# Patient Record
Sex: Male | Born: 1990 | Race: Black or African American | Hispanic: No | Marital: Single | Smoking: Never smoker
Health system: Southern US, Community
[De-identification: ages and names within clinical notes are randomized; demographics above are authoritative.]

## PROBLEM LIST (undated history)

## (undated) DIAGNOSIS — J189 Pneumonia, unspecified organism: Secondary | ICD-10-CM

## (undated) HISTORY — DX: Pneumonia, unspecified organism: J18.9

---
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
## 2011-01-29 NOTE — Op Note (Signed)
[facility]  Patient:    Andrew Ponce, Andrew Ponce Visit Number: [id number] MRN: [id number]          Service Type: DSU Location: DAY Attending Physician:  Dennie Maizes Dictated by:   Dennie Maizes, M.D. Proc. Date: 09/14/01 Admit Date:  09/14/2001 Discharge Date: 09/14/2001   CC:         Karleen Hampshire, M.D.                           Operative Report  PREOPERATIVE DIAGNOSES: 1. Voiding difficulties. 2. Urinary frequency. 3. Meatal stenosis.  POSTOPERATIVE DIAGNOSES: 1. Voiding difficulties. 2. Urinary frequency. 3. Meatal stenosis.  OPERATION:   Meatotomy and cystoscopy.  SURGEON:  Dennie Maizes, M.D.  ANESTHESIA:  General.  COMPLICATIONS:  None.  INDICATIONS:  This 20-year-old male was evaluated for voiding difficulties, slow urinary stream, and urinary frequency and was found to have meatal stenosis.  He is taken to the operating room today for meatotomy and cystoscopy.  DESCRIPTION OF PROCEDURE:   General anesthesia was induced, and the patient was placed on the OR table in the supine position.   The legs were kept in a frog legged position.   The lower abdomen and genitalia were prepped and draped in a sterile fashion.  Severe meatal stenosis was noted.  The ______ of the meatus was crushed for about 5 mm with the hemostat.  A meatotomy was then made.  The edges of the meatus were then reapproximated using 5-0 chromic.  Cystoscopy was done with a 9-French pediatric cystoscope. The urethra was normal.  The verumontanum was normal.  There was no evidence of any posterior ureteral valve or bladder neck obstruction.  The appearance of the bladder was normal.  There was a single orifice on either side with normal configuration.   The instruments were removed.  The patient was transferred to the PACU in satisfactory condition. Dictated by:   Dennie Maizes, M.D. Attending Physician:  Dennie Maizes DD:  09/14/01 TD:  09/14/01 Job:  [id number]

## 2011-01-29 NOTE — H&P (Signed)
[facility]  Patient:    ZAIM, NITTA Visit Number: [id number] MRN: [id number]          Service Type: DSU Location: DAY Attending Physician:  Dennie Maizes Dictated by:   Dennie Maizes, M.D. Admit Date:  09/14/2001   CC:         Karleen Hampshire, M.D.                         History and Physical  CHIEF COMPLAINT:  Voiding difficulty, meatal stenosis.  HISTORY OF PRESENT ILLNESS:  This 20-year-old boy was referred to me by Dr. Regino Schultze for evaluation of GU symptoms.  He has been having suprapubic pain, right groin pain, and right scrotal pain for several weeks.  The pain is intermittent in nature and mild to moderate, with mild to moderate intensity. The patient also had urinary frequency x 10 and nocturia x 1.  He had difficulty in voiding, and his urinary stream was slow.  He has been treated with antibiotics with relief of most of his symptoms.  He continues to have a slow stream and difficulty in voiding.  There is no past history of dysuria, hematuria, or urinary tract infection.  PAST MEDICAL HISTORY:  Unremarkable.  PAST SURGICAL HISTORY:  Status post neonatal circumcision.  MEDICATIONS:  None.  ALLERGIES:  None.  FAMILY HISTORY:  Unremarkable.  PHYSICAL EXAMINATION:  VITAL SIGNS:  Height 4 feet 9 inches, weight 148 pounds.  HEENT:  Normal.  NECK:  No masses.  LUNGS:  Clear to auscultation.  HEART:  Regular rate and rhythm.  No murmurs.  ABDOMEN:  Soft.  No palpable flank mass.  No CVA tenderness.  Bladder not palpable.  GENITOURINARY:  Penis circumcised.  Meatal stenosis is noted.  Testes are normal.  There is no evidence of epididymitis or inguinal hernia.  IMPRESSION: 1. Meatal stenosis. 2. Voiding difficulty.  PLAN:  I discussed with the patient and his mother regarding the diagnosis and management options.  He is scheduled to undergo cystoscopy and meatotomy under anesthesia at [facility].  I have  explained to them about the diagnosis, operative details, alternative treatments, outcome, possible risks and complications, and they have agreed for the procedure to be done. Dictated by:   Dennie Maizes, M.D. Attending Physician:  Dennie Maizes DD:  09/13/01 TD:  09/13/01 Job: [id number]

## 2011-06-18 LAB — STREP A DNA PROBE: Group A Strep Probe: NEGATIVE

## 2011-06-18 LAB — RAPID STREP SCREEN (MED CTR MEBANE ONLY): Streptococcus, Group A Screen (Direct): NEGATIVE

## 2011-09-06 ENCOUNTER — Ambulatory Visit: Payer: 59

## 2011-09-06 DIAGNOSIS — R509 Fever, unspecified: Secondary | ICD-10-CM

## 2011-09-06 DIAGNOSIS — R05 Cough: Secondary | ICD-10-CM

## 2011-09-08 ENCOUNTER — Observation Stay (HOSPITAL_COMMUNITY): Payer: PPO

## 2011-09-08 ENCOUNTER — Observation Stay (HOSPITAL_COMMUNITY)
Admission: AD | Admit: 2011-09-08 | Discharge: 2011-09-10 | Disposition: A | Discharge status: Home / Self Care | Payer: PPO | Source: Ambulatory Visit | Attending: Family Medicine | Admitting: Family Medicine

## 2011-09-08 ENCOUNTER — Ambulatory Visit (INDEPENDENT_AMBULATORY_CARE_PROVIDER_SITE_OTHER): Payer: PPO

## 2011-09-08 DIAGNOSIS — J1100 Influenza due to unidentified influenza virus with unspecified type of pneumonia: Secondary | ICD-10-CM

## 2011-09-08 DIAGNOSIS — J159 Unspecified bacterial pneumonia: Secondary | ICD-10-CM

## 2011-09-08 DIAGNOSIS — J189 Pneumonia, unspecified organism: Principal | ICD-10-CM | POA: Insufficient documentation

## 2011-09-08 DIAGNOSIS — R0600 Dyspnea, unspecified: Secondary | ICD-10-CM | POA: Diagnosis present

## 2011-09-08 DIAGNOSIS — R0602 Shortness of breath: Secondary | ICD-10-CM | POA: Insufficient documentation

## 2011-09-08 LAB — CBC
Hemoglobin: 14.2 g/dL (ref 13.0–17.0)
MCH: 27.4 pg (ref 26.0–34.0)
MCHC: 35.4 g/dL (ref 30.0–36.0)

## 2011-09-08 LAB — COMPREHENSIVE METABOLIC PANEL
ALT: 37 U/L (ref 0–53)
Calcium: 9.9 mg/dL (ref 8.4–10.5)
GFR calc Af Amer: >90 mL/min (ref >90)
Glucose, Bld: 125 mg/dL — ABNORMAL HIGH (ref 70–99)
Sodium: 138 mEq/L (ref 135–145)
Total Protein: 8.5 g/dL — ABNORMAL HIGH (ref 6.0–8.3)

## 2011-09-08 LAB — DIFFERENTIAL
Eosinophils Relative: 3 % (ref 0–5)
Lymphocytes Relative: 23 % (ref 12–46)
Lymphs Abs: 1.5 10*3/uL (ref 0.7–4.0)
Neutrophils Relative %: 67 % (ref 43–77)

## 2011-09-08 MED ORDER — HEPARIN SODIUM (PORCINE) 5000 UNIT/ML IJ SOLN
5000.0000 [IU] | Freq: Three times a day (TID) | INTRAMUSCULAR | Status: DC
  Administered 2011-09-08 – 2011-09-10 (×5): 5000 [IU] via SUBCUTANEOUS
  Filled 2011-09-08 (×8): qty 1

## 2011-09-08 MED ORDER — ACETAMINOPHEN 325 MG PO TABS
650.0000 mg | ORAL_TABLET | Freq: Four times a day (QID) | ORAL | Status: DC | PRN
  Administered 2011-09-09: 650 mg via ORAL
  Filled 2011-09-08: qty 2

## 2011-09-08 MED ORDER — ONDANSETRON HCL 4 MG PO TABS: 4.0000 mg | ORAL_TABLET | Freq: Four times a day (QID) | ORAL | Status: DC | PRN

## 2011-09-08 MED ORDER — DEXTROSE 5 % IV SOLN
500.0000 mg | INTRAVENOUS | Status: DC
  Administered 2011-09-09 – 2011-09-10 (×2): 500 mg via INTRAVENOUS
  Filled 2011-09-08 (×3): qty 500

## 2011-09-08 MED ORDER — BENZONATATE 100 MG PO CAPS
100.0000 mg | ORAL_CAPSULE | Freq: Three times a day (TID) | ORAL | Status: DC | PRN
  Administered 2011-09-09 – 2011-09-10 (×2): 100 mg via ORAL
  Filled 2011-09-08 (×2): qty 1

## 2011-09-08 MED ORDER — DEXTROSE 5 % IV SOLN
1.0000 g | INTRAVENOUS | Status: DC
  Administered 2011-09-09 (×2): 1 g via INTRAVENOUS
  Filled 2011-09-08 (×3): qty 10

## 2011-09-08 MED ORDER — ONDANSETRON HCL 4 MG/2ML IJ SOLN: 4.0000 mg | Freq: Four times a day (QID) | INTRAMUSCULAR | Status: DC | PRN

## 2011-09-08 MED ORDER — ACETAMINOPHEN 650 MG RE SUPP: 650.0000 mg | Freq: Four times a day (QID) | RECTAL | Status: DC | PRN

## 2011-09-08 NOTE — H&P (Signed)
Andrew Ponce is an 20 y.o. male.   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 He also notes fatigue, fevers to 101 at home, chills, and night sweats.The cough is prodcutive of yellow-sputum. The shortness of breath has occurred at rest and is exacerbated by movement. He was evaluated approximately 1 weeks ago at [facility] and started on Tamiflu for clinical influenza in spite of a negative rapid test. After 5 days, his condition did not improve. Therefore, he was started on Omnicef for presumed community acquired pneumonia and Hycodan for the cough on 12/24. His condition did not improve, so he was evaluated again at [facility], where there was concern for a complicated pneumonia, but suspicion for immune compromise given his lack of leukocytosis.  Of note the patient states that he had a normal pre-college physical with immunizations and negative Tb skin test. He does not live in a dorm, denies recent overseas travel, and does not have any pets.    PCP: [facility] [place], [place]   No past medical history on file.  Past Surgical History  Procedure Date  . Knee cartilage surgery 2010    No family history on file. History  Substance Use Topics  . Smoking status: Not on file  . Smokeless tobacco: Not on file  . 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 He lives in an apartment. He denies high risk sexual behavior.     Allergies: No Known Allergies  Medications Prior to Admission: 1. Omnicef 300 mg BID 2. Hycodan   Results for orders placed during the hospital encounter of 09/08/11 (from the past 48 hour(s))  CBC     Status: Abnormal   Collection Time   09/08/11 10:34 PM      Component Value Range Comment   WBC 6.4  4.0 - 10.5  (K/uL)    RBC 5.18  4.22 - 5.81 (MIL/uL)    Hemoglobin 14.2  13.0 - 17.0 (g/dL)    HCT 04.5  40.9 - 81.1 (%)    MCV 77.4 (*) 78.0 - 100.0 (fL)    MCH 27.4  26.0 - 34.0 (pg)    MCHC 35.4  30.0 - 36.0 (g/dL)    RDW 91.4  78.2 - 95.6 (%)    Platelets 335  150 - 400 (K/uL)   COMPREHENSIVE METABOLIC PANEL     Status: Abnormal   Collection Time   09/08/11 10:34 PM      Component Value Range Comment   Sodium 138  135 - 145 (mEq/L)    Potassium 3.5  3.5 - 5.1 (mEq/L)    Chloride 98  96 - 112 (mEq/L)    CO2 26  19 - 32 (mEq/L)    Glucose, Bld 125 (*) 70 - 99 (mg/dL)    BUN 13  6 - 23 (mg/dL)    Creatinine, Ser 2.13  0.50 - 1.35 (mg/dL)    Calcium 9.9  8.4 - 10.5 (mg/dL)    Total Protein 8.5 (*) 6.0 - 8.3 (g/dL)    Albumin 3.8  3.5 - 5.2 (g/dL)    AST 21  0 - 37 (U/L)    ALT 37  0 - 53 (U/L)    Alkaline Phosphatase 73  39 - 117 (  U/L)    Total Bilirubin 0.4  0.3 - 1.2 (mg/dL)    GFR calc non Af Amer >90  >90 (mL/min)    GFR calc Af Amer >90  >90 (mL/min)   DIFFERENTIAL     Status: Normal   Collection Time   09/08/11 10:34 PM      Component Value Range Comment   Neutrophils Relative 67  43 - 77 (%)    Neutro Abs 4.2  1.7 - 7.7 (K/uL)    Lymphocytes Relative 23  12 - 46 (%)    Lymphs Abs 1.5  0.7 - 4.0 (K/uL)    Monocytes Relative 7  3 - 12 (%)    Monocytes Absolute 0.4  0.1 - 1.0 (K/uL)    Eosinophils Relative 3  0 - 5 (%)    Eosinophils Absolute 0.2  0.0 - 0.7 (K/uL)    Basophils Relative 1  0 - 1 (%)    Basophils Absolute 0.0  0.0 - 0.1 (K/uL)    No results found.  Review of Systems  Constitutional: Positive for fever, chills, malaise/fatigue and diaphoresis. Negative for weight loss.  HENT: Positive for congestion and sore throat.   Respiratory: Positive for cough, sputum production, shortness of breath and wheezing. Negative for hemoptysis and stridor.   Cardiovascular: Negative for chest pain and palpitations.  Gastrointestinal: Negative for nausea, vomiting and abdominal  pain.  Genitourinary: Negative for dysuria.  Skin: Negative for itching and rash.  Neurological: Positive for weakness and headaches. Negative for focal weakness.  Endo/Heme/Allergies: Does not bruise/bleed easily.   BP 115/73  Pulse 79  Temp 98.7 F (37.1 C)  Resp 18  SpO2 96%   Physical Exam  Constitutional: He appears well-developed and well-nourished.  Non-toxic appearance. No distress.  HENT:  Head: Normocephalic and atraumatic.  Mouth/Throat: Oropharynx is clear and moist and mucous membranes are normal.  Eyes: Conjunctivae and EOM are normal. Pupils are equal, round, and reactive to light.  Neck: Normal range of motion. Neck supple.  Cardiovascular: Normal rate, regular rhythm, normal heart sounds and normal pulses.   No murmur heard. Respiratory: He has decreased breath sounds in the right lower field and the left lower field. He has no wheezes. He has no rhonchi. He has no rales.       Persistent cough during exam   GI: Soft. Normal appearance and bowel sounds are normal. There is no tenderness.  Musculoskeletal: He exhibits no edema and no tenderness.  Lymphadenopathy:       Head (right side): No submental, no submandibular, no preauricular, no posterior auricular and no occipital adenopathy present.       Head (left side): No submental, no submandibular, no preauricular, no posterior auricular and no occipital adenopathy present.    He has no cervical adenopathy.    He has no axillary adenopathy.       Right: No epitrochlear adenopathy present.       Left: No epitrochlear adenopathy present.  Skin: Skin is warm and dry. No abrasion, no bruising and no rash noted.     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   1. Admit for observation under the care of Family Medicine Teaching Service 2. Dyspnea - get chest X-ray to evaluate;  start Ceftriaxone and Amoxicillin to cover for CAP resistant to outpatient therapy; check HIV given no immune response  3. Cough -  tessalon perles, mucinex for expectorant  4. FENGI - regular diet  5. PPX - heparin 5000 U  TID 6. Dispo - pending further work-up and clinical improvement   Mat Carne 09/09/2011, 12:18 AM   R2 Addendum to R1 History and Physical  BP 115/73  Pulse 79  Temp 98.7 F (37.1 C)  Resp 18  SpO2 96% General appearance: alert, cooperative and no distress Eyes: PERRL, EOMIT Nose: Nares normal. Septum midline. Mucosa normal. No drainage or sinus tenderness. Throat: lips, mucosa, and tongue normal; teeth and gums normal Neck: no adenopathy, no JVD and supple, symmetrical, trachea midline Lungs: clear to auscultation bilaterally and decreased breath sounds in bases Heart: regular rate and rhythm, S1, S2 normal, no murmur, click, rub or gallop Abdomen: soft, non-tender; bowel sounds normal; no masses,  no organomegaly Extremities: extremities normal, atraumatic, no cyanosis or edema Pulses: 2+ and symmetric Skin: Skin color, texture, turgor normal. No rashes or lesions Lymph nodes: Cervical, supraclavicular, and axillary nodes normal.  A/P: 20 year old male with no significant past medical history who comes in with Community Acquired Pneumonia +/- influenza or Viral illness without and elevated WBC count but with fevers, no improvement on outpatient antibiotics:  1) ID- likely this is Community Acquired Pneumonia with viral illness.  However, Dr. Dallas Schimke and [facility] concerned because of cardiomegaly seen on CXR, unusual distribution of infiltrates, and lack of WBC elevation, or immune response.  Will treat for CAP with Ceftriaxone and Azithromycin.  Will also obtain CBC and differential, and obtain HIV.  Consider further evaluation if patient does not improve.  2) Pulm- patient with decreased breath sounds on exam, but has been stable on room  air, monitor pulse oximetry per routine with vitals, oxygen if needed.   Will give tessalon pearls and mucinex as needed for cough 3) CV- patient with no cardiac problems, but cardiomegaly on CXR at urgent care.  Will repeat CXR and consider cardiac evaluation if no improvement.  4) FEN/GI- patient with good PO intake, will give regular diet and saline lock IV.  5) DVT ppx- SQ heparin 6) Disposition- pending clinical improvement.   Ivon Roedel 09/09/2011 12:53 AM

## 2011-09-09 ENCOUNTER — Encounter (HOSPITAL_COMMUNITY): Payer: Self-pay | Admitting: Family Medicine

## 2011-09-09 ENCOUNTER — Observation Stay (HOSPITAL_COMMUNITY): Payer: PPO

## 2011-09-09 ENCOUNTER — Other Ambulatory Visit: Payer: Self-pay

## 2011-09-09 DIAGNOSIS — R0600 Dyspnea, unspecified: Secondary | ICD-10-CM | POA: Diagnosis present

## 2011-09-09 DIAGNOSIS — I517 Cardiomegaly: Secondary | ICD-10-CM

## 2011-09-09 LAB — BASIC METABOLIC PANEL
CO2: 26 mEq/L (ref 19–32)
Chloride: 100 mEq/L (ref 96–112)
Potassium: 4 mEq/L (ref 3.5–5.1)
Sodium: 136 mEq/L (ref 135–145)

## 2011-09-09 MED ORDER — GUAIFENESIN ER 600 MG PO TB12
600.0000 mg | ORAL_TABLET | Freq: Two times a day (BID) | ORAL | Status: DC
  Administered 2011-09-09 – 2011-09-10 (×4): 600 mg via ORAL
  Filled 2011-09-09 (×5): qty 1

## 2011-09-09 NOTE — H&P (Signed)
FMTS Attending Admission Note: Andrew Levy MD [phone] pager office [phone] I  have seen and examined this patient, reviewed their chart. I have discussed this patient with the resident at the time of admission and again  This morning.. I agree with the resident's findings, assessment and care plan. This is an atypical pneumonia in that a healthy 20 year old has not improved on antibiotic therapy as an putpatient and that he has not mounted a white blood cell response. Have discussed with him and his Mom  This morning and we will get a CT scan. The CXR looked like cardiomegally but the cxr was not a great inspiratory film---maybe due to teh pneumonia---so I think I will wait on the CT scan before we decide if we need an ECHo to evaulate for pericardial issues. He has no signs of pericarditis---no rub, etc. HIV pending as well.

## 2011-09-09 NOTE — Progress Notes (Signed)
PGY-1 Daily Progress Note Family Medicine Teaching Service D. Piloto Rolene Arbour, MD Service Pager: [phone]  Patient name: Andrew Ponce  Medical record [id number] Date of birth:28-May-1991 Age: 20 y.o. Gender: male  LOS: 1 day   Subjective: Feeling better today no SOB, No O2 requirement. Afebrile.  Objective:  Vitals: Temp: 99.8 F Pulse Rate: 83  Resp: 16   BP: 120/72 mmHg  SpO2: 95 %  Physical Exam: Constitutional: He appears well-developed and well-nourished. Non-toxic appearance. No distress.  HENT:  Mouth/Throat: Oropharynx is clear and moist and mucous membranes are normal.  Eyes: Conjunctivae and EOM are normal. Pupils are equal, round, and reactive to light.  Neck: Normal range of motion. Neck supple.  Cardiovascular: Normal rate, regular rhythm, normal heart sounds and normal pulses. No murmur heard.  Respiratory: He has decreased breath sounds in the right lower field and the left lower field. Rales present in bilateral lung fields. He has no wheezes and no rhonchi.  GI: Soft. Normal appearance and bowel sounds are normal. There is no tenderness.  Musculoskeletal: No edema and no tenderness.  No Lymphadenopathies present on physical examination.  Skin: Skin is warm and dry. No abrasion, no bruising and no rash noted.   Labs and imaging:  CBC  Lab 09/08/11 2234  WBC 6.4  HGB 14.2  HCT 40.1  PLT 335   BMET  Lab 09/09/11 0618 09/08/11 2234  NA 136 138  K 4.0 3.5  CL 100 98  CO2 26 26  BUN 13 13  CREATININE 1.01 0.99  LABGLOM -- --  GLUCOSE 94 --  CALCIUM 9.2 9.9    X-ray Chest Pa And Lateral  09/09/2011 .  IMPRESSION: Perihilar infiltrates greater on the left with atelectatic changes. Recommend follow-up until clearance.  Cardiomegaly.  Pulmonary vascular prominence.  Original Report Authenticated By: Fuller Canada, M.D.   Medications: Medication Dose Route Frequency  . acetaminophen (TYLENOL) tablet 650 mg  650 mg Oral Q6H PRN    .  acetaminophen (TYLENOL) suppository 650 mg  650 mg Rectal Q6H PRN  . azithromycin (ZITHROMAX) 500 mg in dextrose 5 % 250 mL IVPB  500 mg Intravenous Q24H  . benzonatate (TESSALON) capsule 100 mg  100 mg Oral TID PRN  . cefTRIAXone (ROCEPHIN) 1 g in dextrose 5 % 50 mL IVPB  1 g Intravenous Q24H  . guaiFENesin (MUCINEX) 12 hr tablet 600 mg  600 mg Oral BID  . heparin injection 5,000 Units  5,000 Units Subcutaneous Q8H  . ondansetron (ZOFRAN) tablet 4 mg  4 mg Oral Q6H PRN    . ondansetron (ZOFRAN) injection 4 mg  4 mg Intravenous Q6H PRN   Assessment and Plan:  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   1. PNA: normal WBC, afebrile. But positive on Physical exam of rales on both lung fields. -OnCeftriaxone and Azithromycin to cover for CAP resistant to outpatient therapy and atypical pathogen. -Pending of CT chest. 2. Increased cardiac siluette in CXR: Poor aeration on CXR. Pending ECHO and EKG 12 leads to fully evaluate.   4. FENGI - regular diet  5. PPX - heparin 5000 U [place] TID  6. Dispo - pending further work-up and clinical improvement   D. Piloto Rolene Arbour, MD PGY1, Family Medicine Teaching Service Pager [phone] 09/09/2011

## 2011-09-09 NOTE — Progress Notes (Signed)
Pt admitted for SOB with min exertion, cough and fever for about approx 1 week. Pt repts that his throat is very "irritated". Pt denies HA or body aches. Pt repts that he has phlegm in his throat that he can't cough up because it is very thick and his throat is sore. Pt encouraged to increase fluids to thin secretions and pt instructed proper IS usage. Pt is only able to raise IS to 500 ml. Pt verb understanding of all teachings and agrees to comply. Pt is currently receiving scheduled guafenisen per order.  Pt repts that he has no resp problems at rest and no visible s/sx of such. Pt repts only SOB with min activity. No s/sx cardiac distress and no c/o such. Vital signs stable. Pt oxygen saturation 95% RA. Pt denies pain. Pt repts LBM was "last night". Pt denies nausea or vomiting and repts appetite is "fair to good". Pt voids quantity sufficient without difficulty. Pt ambulates in room with steady gait. Mother is at bedside and is very supportive.

## 2011-09-09 NOTE — Progress Notes (Signed)
Discussed in rounds and agree.  This patient was seen earlier today by Dr. Jennette Kettle as attending.

## 2011-09-09 NOTE — Progress Notes (Signed)
*  PRELIMINARY RESULTS* Echocardiogram 2D Echocardiogram has been performed.  Andrew Ponce [facility] 09/09/2011, 1:48 PM

## 2011-09-10 ENCOUNTER — Encounter (HOSPITAL_COMMUNITY): Payer: Self-pay | Admitting: *Deleted

## 2011-09-10 LAB — HIV ANTIBODY (ROUTINE TESTING W REFLEX): HIV: NONREACTIVE

## 2011-09-10 LAB — LEGIONELLA ANTIGEN, URINE: Legionella Antigen, Urine: NEGATIVE

## 2011-09-10 MED ORDER — LEVOFLOXACIN 750 MG PO TABS: 750.0000 mg | ORAL_TABLET | Freq: Every day | ORAL | Status: DC

## 2011-09-10 MED ORDER — LEVOFLOXACIN 750 MG PO TABS: 750.0000 mg | ORAL_TABLET | Freq: Every day | ORAL | Status: AC

## 2011-09-10 MED ORDER — GUAIFENESIN ER 600 MG PO TB12: 600.0000 mg | ORAL_TABLET | Freq: Two times a day (BID) | ORAL | Status: DC

## 2011-09-10 MED ORDER — GUAIFENESIN ER 600 MG PO TB12: 600.0000 mg | ORAL_TABLET | Freq: Two times a day (BID) | ORAL | Status: AC

## 2011-09-10 MED ORDER — BENZONATATE 100 MG PO CAPS: 100.0000 mg | ORAL_CAPSULE | Freq: Three times a day (TID) | ORAL | Status: AC | PRN

## 2011-09-10 MED ORDER — BENZONATATE 100 MG PO CAPS: 100.0000 mg | ORAL_CAPSULE | Freq: Three times a day (TID) | ORAL | Status: DC | PRN

## 2011-09-10 NOTE — Discharge Summary (Signed)
Physician Discharge Summary  Patient ID: Andrew Ponce [id number] 09-20-1990 20 y.o.  Admit date: 09/08/2011 Discharge date: 09/10/2011  PCP: No primary provider on file.   Discharge Diagnosis: 1. Community Acquired Pneumonia   Discharge Medications  Andrew Ponce  Home Medication Instructions [id number]   Printed on:09/10/11 1238  Medication Information                    benzonatate (TESSALON) 100 MG capsule Take 1 capsule (100 mg total) by mouth 3 (three) times daily as needed for cough.           guaiFENesin (MUCINEX) 600 MG 12 hr tablet Take 1 tablet (600 mg total) by mouth 2 (two) times daily.           levofloxacin (LEVAQUIN) 750 MG tablet Take 1 tablet (750 mg total) by mouth daily.              Consults: None  Labs: CBC  Lab 09/08/11 2234  WBC 6.4  HGB 14.2  HCT 40.1  PLT 335   BMET  Lab 09/09/11 0618 09/08/11 2234  NA 136 138  K 4.0 3.5  CL 100 98  CO2 26 26  BUN 13 13  CREATININE 1.01 0.99  CALCIUM 9.2 9.9  PROT -- 8.5*  BILITOT -- 0.4  ALKPHOS -- 73  ALT -- 37  AST -- 21  GLUCOSE 94 125*   DIFFERENTIAL     Status: Normal   Collection Time   09/08/11 10:34 PM      Component Value Range Comment   Neutrophils Relative 67  43 - 77 (%)    Neutro Abs 4.2  1.7 - 7.7 (K/uL)    Lymphocytes Relative 23  12 - 46 (%)    Lymphs Abs 1.5  0.7 - 4.0 (K/uL)    Monocytes Relative 7  3 - 12 (%)    Monocytes Absolute 0.4  0.1 - 1.0 (K/uL)    Eosinophils Relative 3  0 - 5 (%)    Eosinophils Absolute 0.2  0.0 - 0.7 (K/uL)    Basophils Relative 1  0 - 1 (%)    Basophils Absolute 0.0  0.0 - 0.1 (K/uL)   HIV ANTIBODY (ROUTINE TESTING)     Status: Normal   Collection Time   09/08/11 10:34 PM      Component Value Range Comment   HIV NON REACTIVE  NON REACTIVE    STREP PNEUMONIAE URINARY ANTIGEN     Status: Normal   Collection Time   09/09/11  9:18 AM      Component Value Range Comment   Strep Pneumo Urinary Antigen NEGATIVE  NEGATIVE       Procedures/Imaging:  X-ray Chest Pa And Lateral  09/09/2011  IMPRESSION: Perihilar infiltrates greater on the left with atelectatic changes. Recommend follow-up until clearance.  Cardiomegaly.  Pulmonary vascular prominence.  Original Report Authenticated By: Andrew Ponce, M.D.   Ct Chest Wo Contrast 09/09/2011    IMPRESSION: Bilateral pneumonia, left greater than right, with consolidated and patchy airspace components.  No associated pleural effusions. Please follow-up until clearing.  Original Report Authenticated By: Andrew Ponce, M.D.    Brief Hospital Course: Mr. 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  1. PNA: normal WBC, afebrile. But positive on Physical exam of rales on both lung fields, CXR and CT positive for  PNA. HIV non reactive and strep pneumo negative. -On Ceftriaxone and Azithromycin to cover for CAP resistant to outpatient therapy and atypical pathogen with improvement of SOB. No O2 requirement. Discharge stable and on Levaquin for 7 days. 2. Increased cardiac siluette in CXR: Poor aeration on CXR.  ECHO with mild LVH normal EF. Normal BP during hospitalization. hemodynamically stable.  Patient condition at time of discharge/disposition:  Patient is discharge home on stable medical condition.   Follow up issues: 1. Improvement of PNA. 2. Mild LVH f/u  Discharge follow up:  Discharge Orders    Future Orders Please Complete By Expires   Increase activity slowly     Follow up with [facility]. Pt should make an appointment within 7 to 10 days of today.    D. Andrew Rolene Arbour, MD  [facility] 09/10/2011

## 2011-09-10 NOTE — Progress Notes (Signed)
  Subjective:    Patient ID: Andrew Ponce, male    DOB: November 14, 1990, 20 y.o.   MRN: [id number]  HPI  Seen and examined.  Feels much better.  Discussed in rounds.  Agree with DC today on levoquin.    Review of Systems     Objective:   Physical Exam        Assessment & Plan:

## 2011-09-10 NOTE — Discharge Summary (Signed)
I saw and examined earlier today - please see my note.  I agree with the DC management and documentation by Dr. Piloto. 

## 2011-09-10 NOTE — Plan of Care (Signed)
Problem: Discharge Progression Outcomes Goal: Other Discharge Outcomes/Goals Pt. For f/u at urgent care.

## 2013-04-13 IMAGING — CT CT CHEST W/O CM
4 series · 17 of 30 positions shown, 19 images · non-contrast
Comparison: None.

CLINICAL DATA: Atypical pneumonia

CT CHEST WITHOUT CONTRAST
TECHNIQUE: Multidetector CT imaging of the chest was performed
following the standard protocol without IV contrast.

[Series 2: routine chest · axial · 0.83mm/px · z∈[-233,-23]mm · 5 of 70 slices shown, 7 images]
[im 14/70  mediastinal]
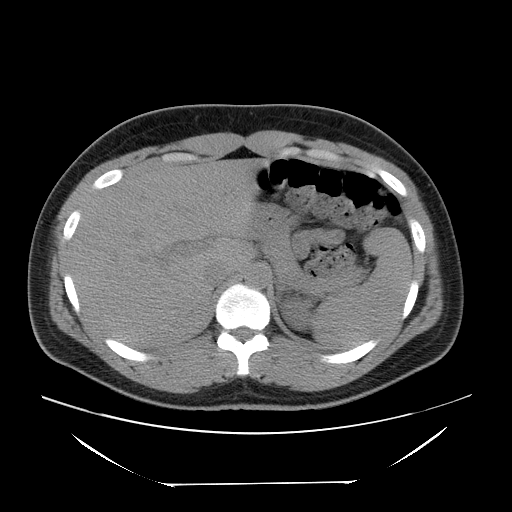
[im 14/70  lung]
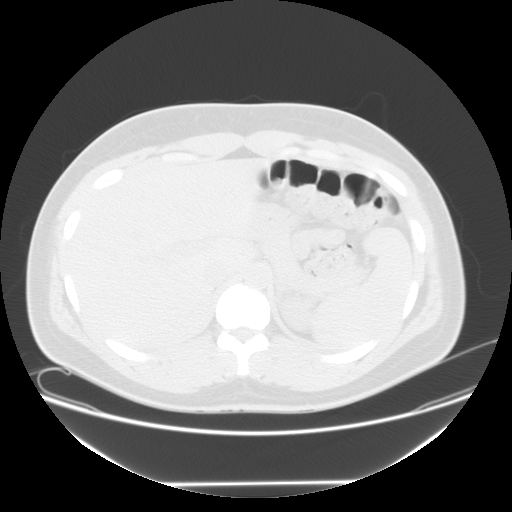
[im 28/70  lung]
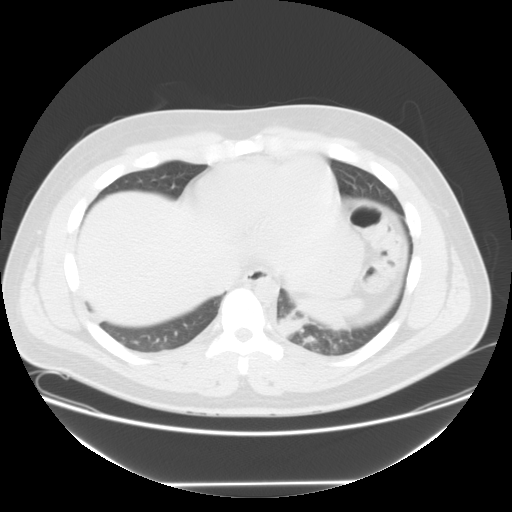
[im 38/70  lung]
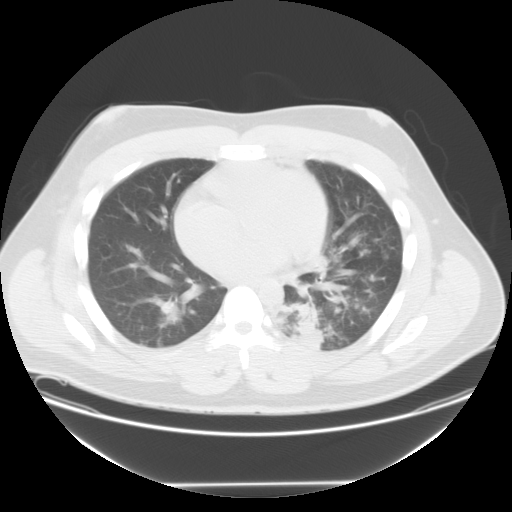
[im 42/70  lung]
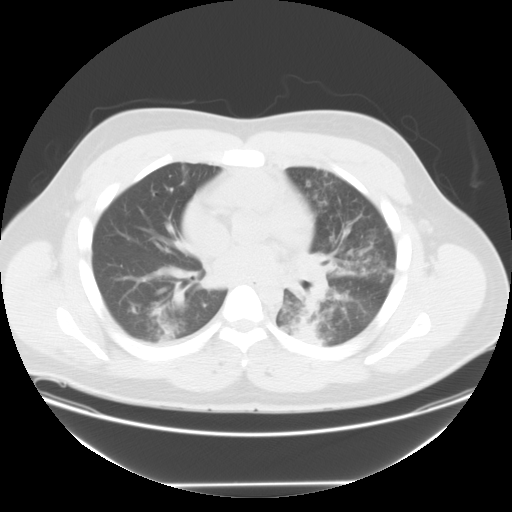
[im 56/70  mediastinal]
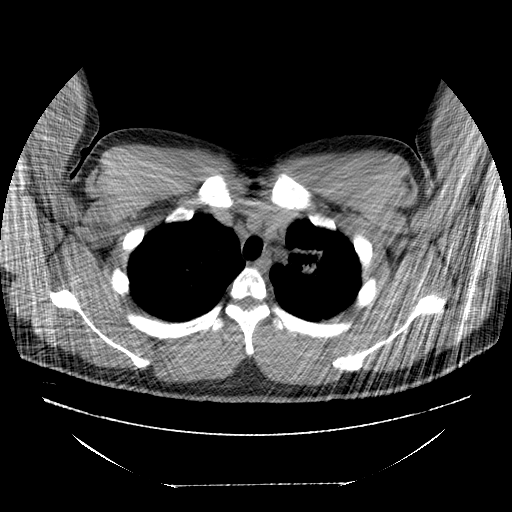
[im 56/70  lung]
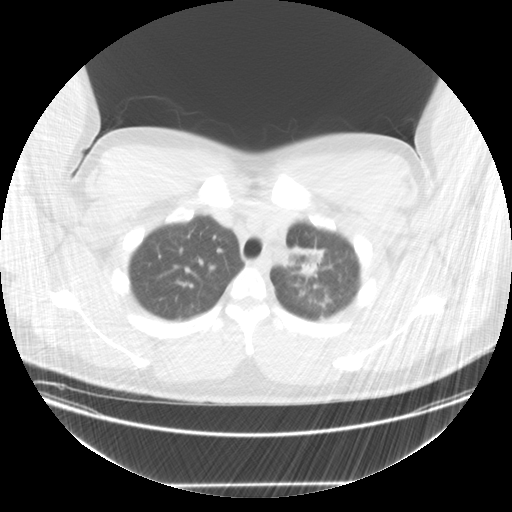

[Series 3: recon 2: routine chest · axial · 0.83mm/px · z∈[-128,-53]mm · 3 of 47 slices shown]
[im 16/47  lung]
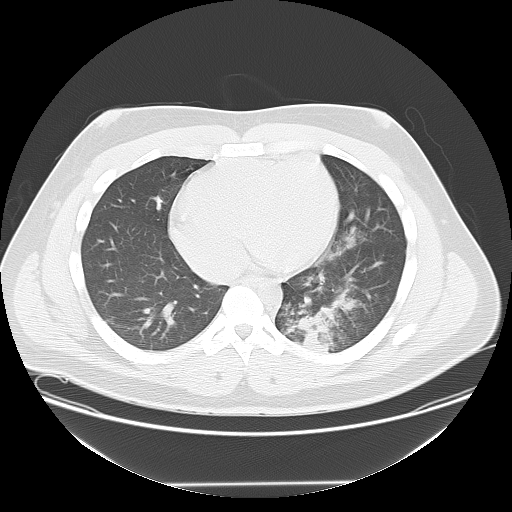
[im 19/47  lung]
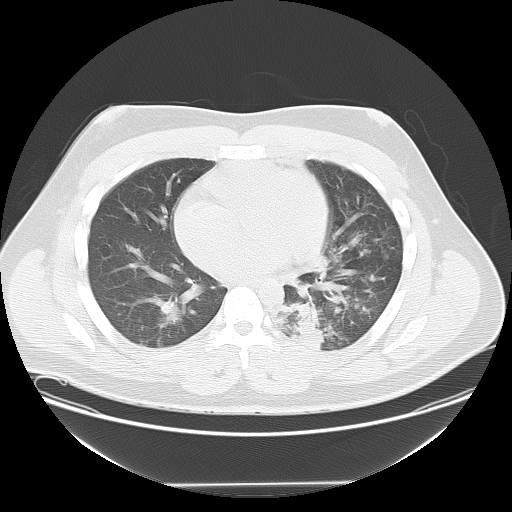
[im 31/47  lung]
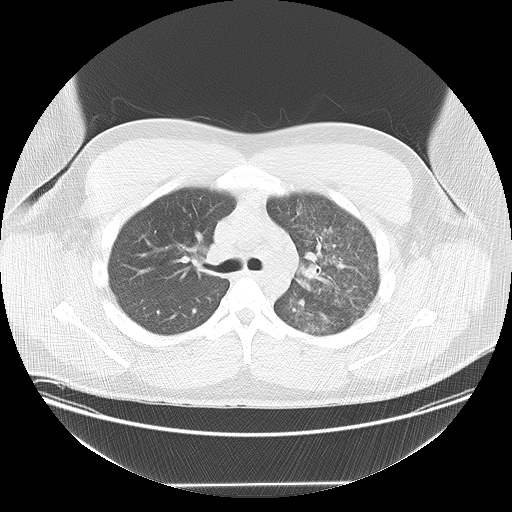

[Series 400: coronals · coronal · 0.83mm/px · 1 of 89 slices shown]
[im 13/89  lung]
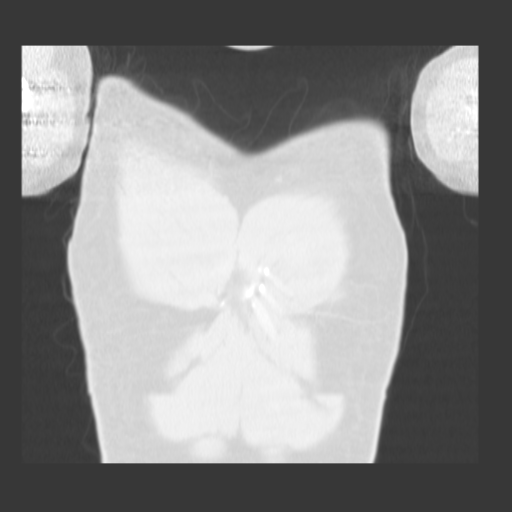

[Series 401: sagittals · sagittal · 0.83mm/px · 8 of 111 slices shown]
[im 13/111  lung]
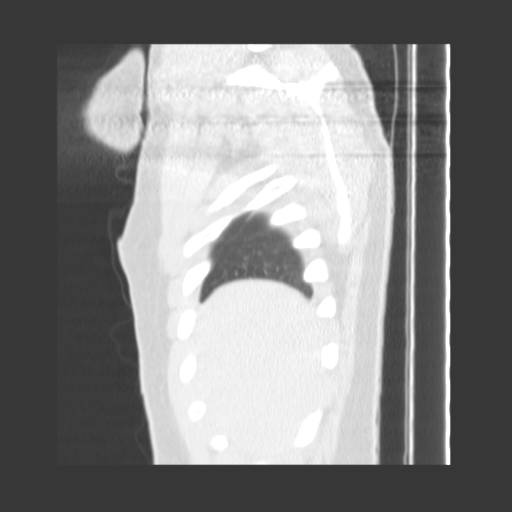
[im 25/111  lung]
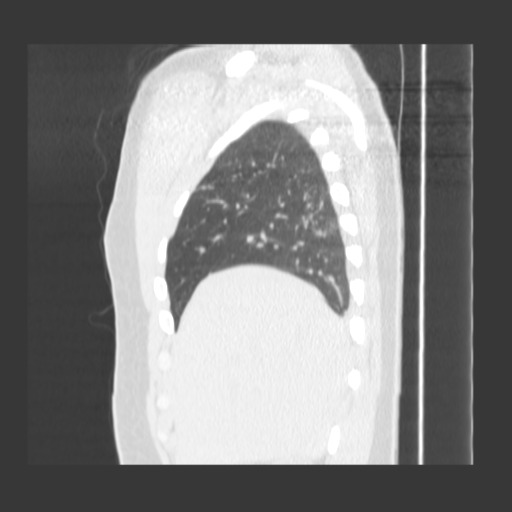
[im 37/111  lung]
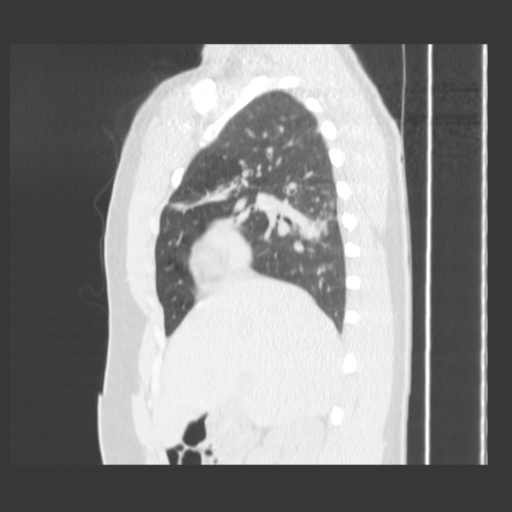
[im 49/111  lung]
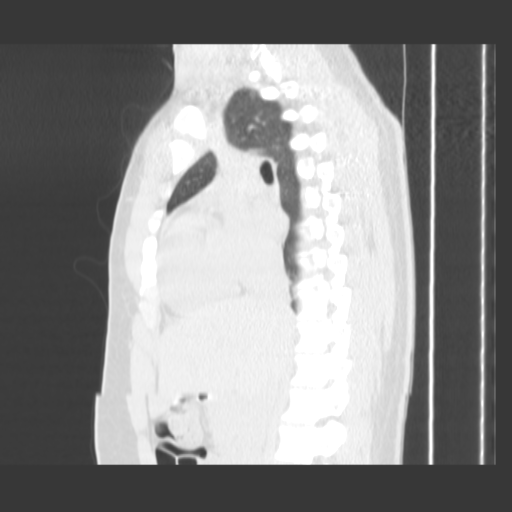
[im 62/111  lung]
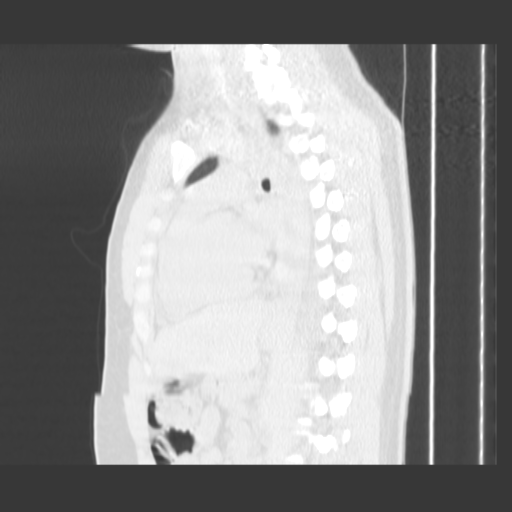
[im 74/111  lung]
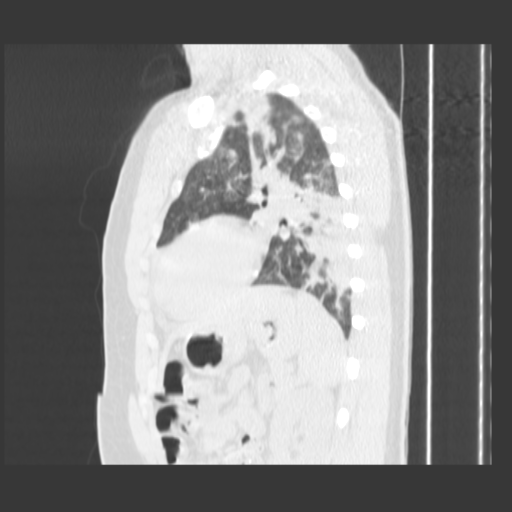
[im 86/111  lung]
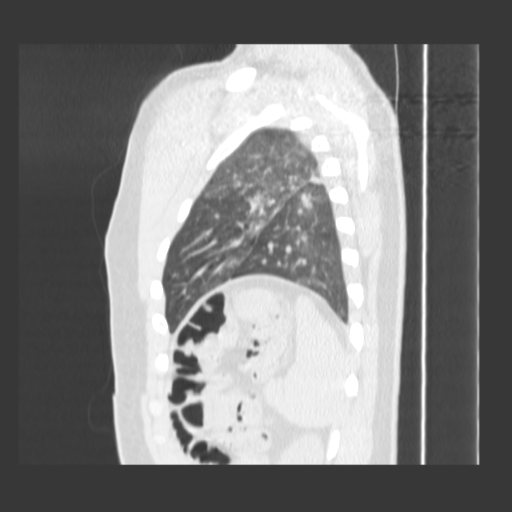
[im 98/111  lung]
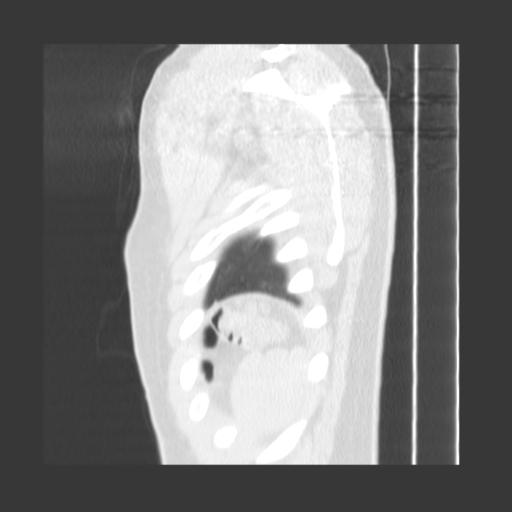

[17 of 30 positions shown; findings below may reference images not displayed]

FINDINGS: There are patchy and consolidated infiltrates
bilaterally, left greater than right.  There are no associated
pleural effusions.  There is no gross axillary, mediastinal, or
hilar adenopathy.  Several AP window lymph nodes are present but
are not enlarged.  The osseous structures and visualized upper
abdomen have an unremarkable noncontrasted appearance.
IMPRESSION: Bilateral pneumonia, left greater than right, with consolidated and
patchy airspace components.  No associated pleural effusions.
Please follow-up until clearing.

## 2015-05-16 ENCOUNTER — Ambulatory Visit (INDEPENDENT_AMBULATORY_CARE_PROVIDER_SITE_OTHER): Payer: Self-pay | Admitting: Family Medicine

## 2015-05-16 VITALS — BP 134/72 | HR 72 | Temp 98.1°F | Resp 18 | Ht 70.0 in | Wt 281.0 lb

## 2015-05-16 DIAGNOSIS — R42 Dizziness and giddiness: Secondary | ICD-10-CM

## 2015-05-16 DIAGNOSIS — R12 Heartburn: Secondary | ICD-10-CM

## 2015-05-16 DIAGNOSIS — R079 Chest pain, unspecified: Secondary | ICD-10-CM

## 2015-05-16 LAB — POCT CBC
GRANULOCYTE PERCENT: 50.2 % (ref 37–80)
HEMATOCRIT: 45.5 % (ref 43.5–53.7)
Hemoglobin: 14.4 g/dL (ref 14.1–18.1)
Lymph, poc: 1.9 (ref 0.6–3.4)
MCH: 25.8 pg — AB (ref 27–31.2)
MCHC: 31.7 g/dL — AB (ref 31.8–35.4)
MCV: 81.3 fL (ref 80–97)
MID (cbc): 0.3 (ref 0–0.9)
MPV: 7.3 fL (ref 0–99.8)
PLATELET COUNT, POC: 201 10*3/uL (ref 142–424)
POC GRANULOCYTE: 2.2 (ref 2–6.9)
POC LYMPH %: 43.1 % (ref 10–50)
POC MID %: 6.7 %M (ref 0–12)
RBC: 5.6 M/uL (ref 4.69–6.13)
RDW, POC: 13.8 %
WBC: 4.3 10*3/uL — AB (ref 4.6–10.2)

## 2015-05-16 LAB — GLUCOSE, POCT (MANUAL RESULT ENTRY): POC Glucose: 103 mg/dl — AB (ref 70–99)

## 2015-05-16 MED ORDER — OMEPRAZOLE 20 MG PO CPDR: 20.0000 mg | DELAYED_RELEASE_CAPSULE | Freq: Every day | ORAL | Status: AC

## 2015-05-16 NOTE — Progress Notes (Signed)
Subjective:    Patient ID: Andrew Ponce, male    DOB: 1991-07-07, 24 y.o.   MRN: [id number]  HPI Andrew Ponce is a 24 y.o. male   Presents with chest pain and dizziness.  Started about 8-9 days - walking around - felt heaviness in chest, no dyspnea, no lightheadedness, no palpitations or n/v at the time.  Tried drinking a soda.  Pain improved after drinking a soda and belching. Has been having some heartburn. Tried otc pills for heartburn he received from his mom.  Minimal improvement. Dizzy/lightheaded with walking at times - 1st noticed 8 days ago. Chest pain and dizziness. Comes and goes. Has not had true syncope. Has to sit down at times. Improves with sitting. Lightheaded with walking. Felt better yesterday, but lifted weights last night, then noted lightheadedness after workout. No chest pain or dizziness during exercise/exertion. Occasional heartburn.   No stool changes, no dark/tarry stools. No current chest pain.   Also feels like his arms are shaky - past 4-5 days. No seizure activity, no weakness, normal use of arms.  Last ate 5 hours ago. No known fh of DM.   Slight stress with work and home, but doesn't feel overwhelmed or anxious.  Moved from [place] few weeks ago, new job - Investment banker, corporate.  Living with mom at the time. Nonsmoker, no alcohol, no IDU. Usually works out 3-5 days per week - no chest pain with this in past.    Chart reviewed, admitted in 2012 for community-acquired pneumonia.  Patient Active Problem List   Diagnosis Date Noted  . Dyspnea 09/09/2011   Past Medical History  Diagnosis Date  . Pneumonia    Past Surgical History  Procedure Laterality Date  . Knee cartilage surgery  2010  . Knee surgery Left 2010   No Known Allergies Prior to Admission medications   Not on File   Social History   Social History  . Marital Status: Single    Spouse Name: N/A  . Number of Children: N/A  . Years of Education: N/A   Occupational History  . Not on  file.   Social History Main Topics  . Smoking status: Never Smoker   . Smokeless tobacco: Not on file  . Alcohol Use: No  . Drug Use: No  . Sexual Activity: Not on file   Other Topics Concern  . 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 He lives in an apartment. He denies high risk sexual behavior.        Review of Systems  Constitutional: Negative for fever, chills and diaphoresis.  Respiratory: Positive for chest tightness. Negative for cough, shortness of breath and wheezing.   Cardiovascular: Positive for chest pain. Negative for palpitations and leg swelling.  Gastrointestinal: Positive for constipation (occasional. ). Negative for abdominal pain.  Genitourinary: Negative for difficulty urinating.  Neurological: Positive for dizziness, tremors (shakiness in arms. ) and light-headedness. Negative for facial asymmetry and numbness.       Objective:   Physical Exam  Constitutional: He is oriented to person, place, and time. He appears well-developed and well-nourished. No distress.  overweight  HENT:  Head: Normocephalic and atraumatic.  Eyes: EOM are normal. Pupils are equal, round, and reactive to light.  Neck: No JVD present. Carotid bruit is not present.  Cardiovascular: Normal rate, regular rhythm and normal heart sounds.   No murmur heard. Pulmonary/Chest: Effort normal  and breath sounds normal. He has no rales.  Abdominal: Soft. Bowel sounds are normal. He exhibits no distension. There is no tenderness.  Musculoskeletal: He exhibits no edema.  Neurological: He is alert and oriented to person, place, and time. He has normal strength. No cranial nerve deficit or sensory deficit. He displays a negative Romberg sign. Coordination normal.  No pronator drift, normal finger to nose.   Skin: Skin is warm and dry. No rash noted. He is not diaphoretic.  Psychiatric: He has a normal mood and affect. His  behavior is normal. Judgment and thought content normal.  Vitals reviewed.  Filed Vitals:   05/16/15 1254  BP: 134/72  Pulse: 72  Temp: 98.1 F (36.7 C)  TempSrc: Oral  Resp: 18  Height:  (1.778 m)  Weight: 281 lb (127.461 kg)  SpO2: 99%   EKG: sinus rhythm, no acute findings.   Results for orders placed or performed in visit on 05/16/15  POCT glucose (manual entry)  Result Value Ref Range   POC Glucose 103 (A) 70 - 99 mg/dl  POCT CBC  Result Value Ref Range   WBC 4.3 (A) 4.6 - 10.2 K/uL   Lymph, poc 1.9 0.6 - 3.4   POC LYMPH PERCENT 43.1 10 - 50 %L   MID (cbc) 0.3 0 - 0.9   POC MID % 6.7 0 - 12 %M   POC Granulocyte 2.2 2 - 6.9   Granulocyte percent 50.2 37 - 80 %G   RBC 5.60 4.69 - 6.13 M/uL   Hemoglobin 14.4 14.1 - 18.1 g/dL   HCT, POC 19.1 47.8 - 53.7 %   MCV 81.3 80 - 97 fL   MCH, POC 25.8 (A) 27 - 31.2 pg   MCHC 31.7 (A) 31.8 - 35.4 g/dL   RDW, POC 29.5 %   Platelet Count, POC 201 142 - 424 K/uL   MPV 7.3 0 - 99.8 fL       Assessment & Plan:   Andrew Ponce is a 24 y.o. male Chest pain, unspecified chest pain type - Plan: EKG 12-Lead, POCT CBC Heartburn - Plan: omeprazole (PRILOSEC) 20 MG capsule  -Overall reassuring blood count, and EKG. Based on his description of symptoms, there may be a heartburn as primary cause. Can try trigger avoidance with handout below, start omeprazole. If symptoms are not improving, would recommend evaluation with cardiologist. RTC/ER chest pain precautions reviewed.  Dizziness - Plan: POCT glucose (manual entry), POCT CBC  -Reassuring CBC and vital signs. Advised increased fluids, but if the symptoms persist, especially with any recurrence of chest pain, would recommend cardiology evaluation. Avoid exertional exercise for now until symptoms have completely resolved, then if they do recur with exercise, return to clinic for evaluation, or ER if necessary. Understanding expressed.  Meds ordered this encounter  Medications    . omeprazole (PRILOSEC) 20 MG capsule    Sig: Take 1 capsule (20 mg total) by mouth daily.    Dispense:  30 capsule    Refill:  1   Patient Instructions  Your EKG and blood work today looked overall okay. Rest of this weekend, drink 20 of fluids, start acid blocker as your symptoms may be due to heartburn, and avoid foods as listed below that can worsen heartburn. If your dizziness or chest pain is persisting into next week, I would recommend you be seen by cardiologist. Call me and I can arrange for this to happen. If any worsening of symptoms, go to emergency  room. Did not return to workouts or exertion until your symptoms have resolved, then if they do come back, you should be evaluated by cardiologist.  Return to the clinic or go to the nearest emergency room if any of your symptoms worsen or new symptoms occur.  Chest Pain (Nonspecific) It is often hard to give a specific diagnosis for the cause of chest pain. There is always a chance that your pain could be related to something serious, such as a heart attack or a blood clot in the lungs. You need to follow up with your health care provider for further evaluation. CAUSES   Heartburn.  Pneumonia or bronchitis.  Anxiety or stress.  Inflammation around your heart (pericarditis) or lung (pleuritis or pleurisy).  A blood clot in the lung.  A collapsed lung (pneumothorax). It can develop suddenly on its own (spontaneous pneumothorax) or from trauma to the chest.  Shingles infection (herpes zoster virus). The chest wall is composed of bones, muscles, and cartilage. Any of these can be the source of the pain.  The bones can be bruised by injury.  The muscles or cartilage can be strained by coughing or overwork.  The cartilage can be affected by inflammation and become sore (costochondritis). DIAGNOSIS  Lab tests or other studies may be needed to find the cause of your pain. Your health care provider may have you take a test called  an ambulatory electrocardiogram (ECG). An ECG records your heartbeat patterns over a 24-hour period. You may also have other tests, such as:  Transthoracic echocardiogram (TTE). During echocardiography, sound waves are used to evaluate how blood flows through your heart.  Transesophageal echocardiogram (TEE).  Cardiac monitoring. This allows your health care provider to monitor your heart rate and rhythm in real time.  Holter monitor. This is a portable device that records your heartbeat and can help diagnose heart arrhythmias. It allows your health care provider to track your heart activity for several days, if needed.  Stress tests by exercise or by giving medicine that makes the heart beat faster. TREATMENT   Treatment depends on what may be causing your chest pain. Treatment may include:  Acid blockers for heartburn.  Anti-inflammatory medicine.  Pain medicine for inflammatory conditions.  Antibiotics if an infection is present.  You may be advised to change lifestyle habits. This includes stopping smoking and avoiding alcohol, caffeine, and chocolate.  You may be advised to keep your head raised (elevated) when sleeping. This reduces the chance of acid going backward from your stomach into your esophagus. Most of the time, nonspecific chest pain will improve within 2-3 days with rest and mild pain medicine.  HOME CARE INSTRUCTIONS   If antibiotics were prescribed, take them as directed. Finish them even if you start to feel better.  For the next few days, avoid physical activities that bring on chest pain. Continue physical activities as directed.  Do not use any tobacco products, including cigarettes, chewing tobacco, or electronic cigarettes.  Avoid drinking alcohol.  Only take medicine as directed by your health care provider.  Follow your health care provider's suggestions for further testing if your chest pain does not go away.  Keep any follow-up appointments you  made. If you do not go to an appointment, you could develop lasting (chronic) problems with pain. If there is any problem keeping an appointment, call to reschedule. SEEK MEDICAL CARE IF:   Your chest pain does not go away, even after treatment.  You have a rash  with blisters on your chest.  You have a fever. SEEK IMMEDIATE MEDICAL CARE IF:   You have increased chest pain or pain that spreads to your arm, neck, jaw, back, or abdomen.  You have shortness of breath.  You have an increasing cough, or you cough up blood.  You have severe back or abdominal pain.  You feel nauseous or vomit.  You have severe weakness.  You faint.  You have chills. This is an emergency. Do not wait to see if the pain will go away. Get medical help at once. Call your local emergency services (911 in [place] Do not drive yourself to the hospital. MAKE SURE YOU:   Understand these instructions.  Will watch your condition.  Will get help right away if you are not doing well or get worse. Document Released: 06/09/2005 Document Revised: 09/04/2013 Document Reviewed: 04/04/2008 [facility] Patient Information 2015 [place], [place]. This information is not intended to replace advice given to you by your health care provider. Make sure you discuss any questions you have with your health care provider. Food Choices for Gastroesophageal Reflux Disease When you have gastroesophageal reflux disease (GERD), the foods you eat and your eating habits are very important. Choosing the right foods can help ease the discomfort of GERD. WHAT GENERAL GUIDELINES DO I NEED TO FOLLOW?  Choose fruits, vegetables, whole grains, low-fat dairy products, and low-fat meat, fish, and poultry.  Limit fats such as oils, salad dressings, butter, nuts, and avocado.  Keep a food diary to identify foods that cause symptoms.  Avoid foods that cause reflux. These may be different for different people.  Eat frequent small meals instead of  three large meals each day.  Eat your meals slowly, in a relaxed setting.  Limit fried foods.  Cook foods using methods other than frying.  Avoid drinking alcohol.  Avoid drinking large amounts of liquids with your meals.  Avoid bending over or lying down until 2-3 hours after eating. WHAT FOODS ARE NOT RECOMMENDED? The following are some foods and drinks that may worsen your symptoms: Vegetables Tomatoes. Tomato juice. Tomato and spaghetti sauce. Chili peppers. Onion and garlic. Horseradish. Fruits Oranges, grapefruit, and lemon (fruit and juice). Meats High-fat meats, fish, and poultry. This includes hot dogs, ribs, ham, sausage, salami, and bacon. Dairy Whole milk and chocolate milk. Sour cream. Cream. Butter. Ice cream. Cream cheese.  Beverages Coffee and tea, with or without caffeine. Carbonated beverages or energy drinks. Condiments Hot sauce. Barbecue sauce.  Sweets/Desserts Chocolate and cocoa. Donuts. Peppermint and spearmint. Fats and Oils High-fat foods, including [place] fries and potato chips. Other Vinegar. Strong spices, such as black pepper, white pepper, red pepper, cayenne, curry powder, cloves, ginger, and chili powder. The items listed above may not be a complete list of foods and beverages to avoid. Contact your dietitian for more information. Document Released: 08/30/2005 Document Revised: 09/04/2013 Document Reviewed: 07/04/2013 [facility] Patient Information 2015 [place], [place]. This information is not intended to replace advice given to you by your health care provider. Make sure you discuss any questions you have with your health care provider.  Dizziness Dizziness is a common problem. It is a feeling of unsteadiness or light-headedness. You may feel like you are about to faint. Dizziness can lead to injury if you stumble or fall. A person of any age group can suffer from dizziness, but dizziness is more common in older adults. CAUSES  Dizziness can be  caused by many different things, including:  Middle ear problems.  Standing for too long.  Infections.  An allergic reaction.  Aging.  An emotional response to something, such as the sight of blood.  Side effects of medicines.  Tiredness.  Problems with circulation or blood pressure.  Excessive use of alcohol or medicines, or illegal drug use.  Breathing too fast (hyperventilation).  An irregular heart rhythm (arrhythmia).  A low red blood cell count (anemia).  Pregnancy.  Vomiting, diarrhea, fever, or other illnesses that cause body fluid loss (dehydration).  Diseases or conditions such as Parkinson's disease, high blood pressure (hypertension), diabetes, and thyroid problems.  Exposure to extreme heat. DIAGNOSIS  Your health care provider will ask about your symptoms, perform a physical exam, and perform an electrocardiogram (ECG) to record the electrical activity of your heart. Your health care provider may also perform other heart or blood tests to determine the cause of your dizziness. These may include:  Transthoracic echocardiogram (TTE). During echocardiography, sound waves are used to evaluate how blood flows through your heart.  Transesophageal echocardiogram (TEE).  Cardiac monitoring. This allows your health care provider to monitor your heart rate and rhythm in real time.  Holter monitor. This is a portable device that records your heartbeat and can help diagnose heart arrhythmias. It allows your health care provider to track your heart activity for several days if needed.  Stress tests by exercise or by giving medicine that makes the heart beat faster. TREATMENT  Treatment of dizziness depends on the cause of your symptoms and can vary greatly. HOME CARE INSTRUCTIONS   Drink enough fluids to keep your urine clear or pale yellow. This is especially important in very hot weather. In older adults, it is also important in cold weather.  Take your  medicine exactly as directed if your dizziness is caused by medicines. When taking blood pressure medicines, it is especially important to get up slowly.  Rise slowly from chairs and steady yourself until you feel okay.  In the morning, first sit up on the side of the bed. When you feel okay, stand slowly while holding onto something until you know your balance is fine.  Move your legs often if you need to stand in one place for a long time. Tighten and relax your muscles in your legs while standing.  Have someone stay with you for 1-2 days if dizziness continues to be a problem. Do this until you feel you are well enough to stay alone. Have the person call your health care provider if he or she notices changes in you that are concerning.  Do not drive or use heavy machinery if you feel dizzy.  Do not drink alcohol. SEEK IMMEDIATE MEDICAL CARE IF:   Your dizziness or light-headedness gets worse.  You feel nauseous or vomit.  You have problems talking, walking, or using your arms, hands, or legs.  You feel weak.  You are not thinking clearly or you have trouble forming sentences. It may take a friend or family member to notice this.  You have chest pain, abdominal pain, shortness of breath, or sweating.  Your vision changes.  You notice any bleeding.  You have side effects from medicine that seems to be getting worse rather than better. MAKE SURE YOU:   Understand these instructions.  Will watch your condition.  Will get help right away if you are not doing well or get worse. Document Released: 02/23/2001 Document Revised: 09/04/2013 Document Reviewed: 03/19/2011 [facility] Patient Information 2015 [place], [place]. This information is not intended to  replace advice given to you by your health care provider. Make sure you discuss any questions you have with your health care provider.     I personally performed the services described in this documentation, which was scribed in  my presence. The recorded information has been reviewed and considered, and addended by me as needed.

## 2015-05-16 NOTE — Patient Instructions (Signed)
Your EKG and blood work today looked overall okay. Rest of this weekend, drink 20 of fluids, start acid blocker as your symptoms may be due to heartburn, and avoid foods as listed below that can worsen heartburn. If your dizziness or chest pain is persisting into next week, I would recommend you be seen by cardiologist. Call me and I can arrange for this to happen. If any worsening of symptoms, go to emergency room. Did not return to workouts or exertion until your symptoms have resolved, then if they do come back, you should be evaluated by cardiologist.  Return to the clinic or go to the nearest emergency room if any of your symptoms worsen or new symptoms occur.  Chest Pain (Nonspecific) It is often hard to give a specific diagnosis for the cause of chest pain. There is always a chance that your pain could be related to something serious, such as a heart attack or a blood clot in the lungs. You need to follow up with your health care provider for further evaluation. CAUSES   Heartburn.  Pneumonia or bronchitis.  Anxiety or stress.  Inflammation around your heart (pericarditis) or lung (pleuritis or pleurisy).  A blood clot in the lung.  A collapsed lung (pneumothorax). It can develop suddenly on its own (spontaneous pneumothorax) or from trauma to the chest.  Shingles infection (herpes zoster virus). The chest wall is composed of bones, muscles, and cartilage. Any of these can be the source of the pain.  The bones can be bruised by injury.  The muscles or cartilage can be strained by coughing or overwork.  The cartilage can be affected by inflammation and become sore (costochondritis). DIAGNOSIS  Lab tests or other studies may be needed to find the cause of your pain. Your health care provider may have you take a test called an ambulatory electrocardiogram (ECG). An ECG records your heartbeat patterns over a 24-hour period. You may also have other tests, such as:  Transthoracic  echocardiogram (TTE). During echocardiography, sound waves are used to evaluate how blood flows through your heart.  Transesophageal echocardiogram (TEE).  Cardiac monitoring. This allows your health care provider to monitor your heart rate and rhythm in real time.  Holter monitor. This is a portable device that records your heartbeat and can help diagnose heart arrhythmias. It allows your health care provider to track your heart activity for several days, if needed.  Stress tests by exercise or by giving medicine that makes the heart beat faster. TREATMENT   Treatment depends on what may be causing your chest pain. Treatment may include:  Acid blockers for heartburn.  Anti-inflammatory medicine.  Pain medicine for inflammatory conditions.  Antibiotics if an infection is present.  You may be advised to change lifestyle habits. This includes stopping smoking and avoiding alcohol, caffeine, and chocolate.  You may be advised to keep your head raised (elevated) when sleeping. This reduces the chance of acid going backward from your stomach into your esophagus. Most of the time, nonspecific chest pain will improve within 2-3 days with rest and mild pain medicine.  HOME CARE INSTRUCTIONS   If antibiotics were prescribed, take them as directed. Finish them even if you start to feel better.  For the next few days, avoid physical activities that bring on chest pain. Continue physical activities as directed.  Do not use any tobacco products, including cigarettes, chewing tobacco, or electronic cigarettes.  Avoid drinking alcohol.  Only take medicine as directed by your health care  provider.  Follow your health care provider's suggestions for further testing if your chest pain does not go away.  Keep any follow-up appointments you made. If you do not go to an appointment, you could develop lasting (chronic) problems with pain. If there is any problem keeping an appointment, call to  reschedule. SEEK MEDICAL CARE IF:   Your chest pain does not go away, even after treatment.  You have a rash with blisters on your chest.  You have a fever. SEEK IMMEDIATE MEDICAL CARE IF:   You have increased chest pain or pain that spreads to your arm, neck, jaw, back, or abdomen.  You have shortness of breath.  You have an increasing cough, or you cough up blood.  You have severe back or abdominal pain.  You feel nauseous or vomit.  You have severe weakness.  You faint.  You have chills. This is an emergency. Do not wait to see if the pain will go away. Get medical help at once. Call your local emergency services (911 in [place] Do not drive yourself to the hospital. MAKE SURE YOU:   Understand these instructions.  Will watch your condition.  Will get help right away if you are not doing well or get worse. Document Released: 06/09/2005 Document Revised: 09/04/2013 Document Reviewed: 04/04/2008 [facility] Patient Information 2015 [place], [place]. This information is not intended to replace advice given to you by your health care provider. Make sure you discuss any questions you have with your health care provider. Food Choices for Gastroesophageal Reflux Disease When you have gastroesophageal reflux disease (GERD), the foods you eat and your eating habits are very important. Choosing the right foods can help ease the discomfort of GERD. WHAT GENERAL GUIDELINES DO I NEED TO FOLLOW?  Choose fruits, vegetables, whole grains, low-fat dairy products, and low-fat meat, fish, and poultry.  Limit fats such as oils, salad dressings, butter, nuts, and avocado.  Keep a food diary to identify foods that cause symptoms.  Avoid foods that cause reflux. These may be different for different people.  Eat frequent small meals instead of three large meals each day.  Eat your meals slowly, in a relaxed setting.  Limit fried foods.  Cook foods using methods other than frying.  Avoid  drinking alcohol.  Avoid drinking large amounts of liquids with your meals.  Avoid bending over or lying down until 2-3 hours after eating. WHAT FOODS ARE NOT RECOMMENDED? The following are some foods and drinks that may worsen your symptoms: Vegetables Tomatoes. Tomato juice. Tomato and spaghetti sauce. Chili peppers. Onion and garlic. Horseradish. Fruits Oranges, grapefruit, and lemon (fruit and juice). Meats High-fat meats, fish, and poultry. This includes hot dogs, ribs, ham, sausage, salami, and bacon. Dairy Whole milk and chocolate milk. Sour cream. Cream. Butter. Ice cream. Cream cheese.  Beverages Coffee and tea, with or without caffeine. Carbonated beverages or energy drinks. Condiments Hot sauce. Barbecue sauce.  Sweets/Desserts Chocolate and cocoa. Donuts. Peppermint and spearmint. Fats and Oils High-fat foods, including [place] fries and potato chips. Other Vinegar. Strong spices, such as black pepper, white pepper, red pepper, cayenne, curry powder, cloves, ginger, and chili powder. The items listed above may not be a complete list of foods and beverages to avoid. Contact your dietitian for more information. Document Released: 08/30/2005 Document Revised: 09/04/2013 Document Reviewed: 07/04/2013 [facility] Patient Information 2015 [place], [place]. This information is not intended to replace advice given to you by your health care provider. Make sure you discuss any questions  you have with your health care provider.  Dizziness Dizziness is a common problem. It is a feeling of unsteadiness or light-headedness. You may feel like you are about to faint. Dizziness can lead to injury if you stumble or fall. A person of any age group can suffer from dizziness, but dizziness is more common in older adults. CAUSES  Dizziness can be caused by many different things, including:  Middle ear problems.  Standing for too long.  Infections.  An allergic reaction.  Aging.  An  emotional response to something, such as the sight of blood.  Side effects of medicines.  Tiredness.  Problems with circulation or blood pressure.  Excessive use of alcohol or medicines, or illegal drug use.  Breathing too fast (hyperventilation).  An irregular heart rhythm (arrhythmia).  A low red blood cell count (anemia).  Pregnancy.  Vomiting, diarrhea, fever, or other illnesses that cause body fluid loss (dehydration).  Diseases or conditions such as Parkinson's disease, high blood pressure (hypertension), diabetes, and thyroid problems.  Exposure to extreme heat. DIAGNOSIS  Your health care provider will ask about your symptoms, perform a physical exam, and perform an electrocardiogram (ECG) to record the electrical activity of your heart. Your health care provider may also perform other heart or blood tests to determine the cause of your dizziness. These may include:  Transthoracic echocardiogram (TTE). During echocardiography, sound waves are used to evaluate how blood flows through your heart.  Transesophageal echocardiogram (TEE).  Cardiac monitoring. This allows your health care provider to monitor your heart rate and rhythm in real time.  Holter monitor. This is a portable device that records your heartbeat and can help diagnose heart arrhythmias. It allows your health care provider to track your heart activity for several days if needed.  Stress tests by exercise or by giving medicine that makes the heart beat faster. TREATMENT  Treatment of dizziness depends on the cause of your symptoms and can vary greatly. HOME CARE INSTRUCTIONS   Drink enough fluids to keep your urine clear or pale yellow. This is especially important in very hot weather. In older adults, it is also important in cold weather.  Take your medicine exactly as directed if your dizziness is caused by medicines. When taking blood pressure medicines, it is especially important to get up  slowly.  Rise slowly from chairs and steady yourself until you feel okay.  In the morning, first sit up on the side of the bed. When you feel okay, stand slowly while holding onto something until you know your balance is fine.  Move your legs often if you need to stand in one place for a long time. Tighten and relax your muscles in your legs while standing.  Have someone stay with you for 1-2 days if dizziness continues to be a problem. Do this until you feel you are well enough to stay alone. Have the person call your health care provider if he or she notices changes in you that are concerning.  Do not drive or use heavy machinery if you feel dizzy.  Do not drink alcohol. SEEK IMMEDIATE MEDICAL CARE IF:   Your dizziness or light-headedness gets worse.  You feel nauseous or vomit.  You have problems talking, walking, or using your arms, hands, or legs.  You feel weak.  You are not thinking clearly or you have trouble forming sentences. It may take a friend or family member to notice this.  You have chest pain, abdominal pain, shortness of breath,  or sweating.  Your vision changes.  You notice any bleeding.  You have side effects from medicine that seems to be getting worse rather than better. MAKE SURE YOU:   Understand these instructions.  Will watch your condition.  Will get help right away if you are not doing well or get worse. Document Released: 02/23/2001 Document Revised: 09/04/2013 Document Reviewed: 03/19/2011 [facility] Patient Information 2015 [place]. This information is not intended to replace advice given to you by your health care provider. Make sure you discuss any questions you have with your health care provider.

## 2016-02-27 ENCOUNTER — Ambulatory Visit: Payer: Self-pay

## 2016-02-27 ENCOUNTER — Encounter (HOSPITAL_COMMUNITY): Payer: Self-pay | Admitting: Emergency Medicine

## 2016-02-27 ENCOUNTER — Ambulatory Visit (HOSPITAL_COMMUNITY)
Admission: EM | Admit: 2016-02-27 | Discharge: 2016-02-27 | Disposition: A | Discharge status: Home / Self Care | Payer: Self-pay | Attending: Family Medicine | Admitting: Family Medicine

## 2016-02-27 DIAGNOSIS — Z9889 Other specified postprocedural states: Secondary | ICD-10-CM | POA: Insufficient documentation

## 2016-02-27 DIAGNOSIS — J309 Allergic rhinitis, unspecified: Secondary | ICD-10-CM

## 2016-02-27 DIAGNOSIS — R0982 Postnasal drip: Secondary | ICD-10-CM

## 2016-02-27 DIAGNOSIS — Z79899 Other long term (current) drug therapy: Secondary | ICD-10-CM | POA: Insufficient documentation

## 2016-02-27 LAB — POCT RAPID STREP A: STREPTOCOCCUS, GROUP A SCREEN (DIRECT): NEGATIVE

## 2016-02-27 MED ORDER — FLUTICASONE PROPIONATE 50 MCG/ACT NA SUSP: 2.0000 | Freq: Every day | NASAL | Status: AC

## 2016-02-27 NOTE — Discharge Instructions (Signed)
Allergic Rhinitis Allergic rhinitis is when the mucous membranes in the nose respond to allergens. Allergens are particles in the air that cause your body to have an allergic reaction. This causes you to release allergic antibodies. Through a chain of events, these eventually cause you to release histamine into the blood stream. Although meant to protect the body, it is this release of histamine that causes your discomfort, such as frequent sneezing, congestion, and an itchy, runny nose.  CAUSES Seasonal allergic rhinitis (hay fever) is caused by pollen allergens that may come from grasses, trees, and weeds. Year-round allergic rhinitis (perennial allergic rhinitis) is caused by allergens such as house dust mites, pet dander, and mold spores. SYMPTOMS  Nasal stuffiness (congestion).  Itchy, runny nose with sneezing and tearing of the eyes. DIAGNOSIS Your health care provider can help you determine the allergen or allergens that trigger your symptoms. If you and your health care provider are unable to determine the allergen, skin or blood testing may be used. Your health care provider will diagnose your condition after taking your health history and performing a physical exam. Your health care provider may assess you for other related conditions, such as asthma, pink eye, or an ear infection. TREATMENT Allergic rhinitis does not have a cure, but it can be controlled by:  Medicines that block allergy symptoms. These may include allergy shots, nasal sprays, and oral antihistamines.  Avoiding the allergen. Hay fever may often be treated with antihistamines in pill or nasal spray forms. Antihistamines block the effects of histamine. There are over-the-counter medicines that may help with nasal congestion and swelling around the eyes. Check with your health care provider before taking or giving this medicine. If avoiding the allergen or the medicine prescribed do not work, there are many new medicines  your health care provider can prescribe. Stronger medicine may be used if initial measures are ineffective. Desensitizing injections can be used if medicine and avoidance does not work. Desensitization is when a patient is given ongoing shots until the body becomes less sensitive to the allergen. Make sure you follow up with your health care provider if problems continue. HOME CARE INSTRUCTIONS It is not possible to completely avoid allergens, but you can reduce your symptoms by taking steps to limit your exposure to them. It helps to know exactly what you are allergic to so that you can avoid your specific triggers. SEEK MEDICAL CARE IF:  You have a fever.  You develop a cough that does not stop easily (persistent).  You have shortness of breath.  You start wheezing.  Symptoms interfere with normal daily activities.   This information is not intended to replace advice given to you by your health care provider. Make sure you discuss any questions you have with your health care provider.   Document Released: 05/25/2001 Document Revised: 09/20/2014 Document Reviewed: 05/07/2013 Elsevier Interactive Patient Education 2016 [employer]  

## 2016-02-27 NOTE — ED Notes (Signed)
C/o ST onset 5/23 associated w/odynophagia... Denies fevers, chills.... A&O x4... No acute distress.

## 2016-02-27 NOTE — ED Provider Notes (Signed)
CSN: [id number]     Arrival date & time 02/27/16  1329 History   None    Chief Complaint  Patient presents with  . Sore Throat   (Consider location/radiation/quality/duration/timing/severity/associated sxs/prior Treatment) HPI Comments: Patient c/o sore throat this am and after eating the sore throat resolved.  He denies fevers.  Patient is a 25 y.o. male presenting with pharyngitis. The history is provided by the patient.  Sore Throat This is a new problem. The current episode started 12 to 24 hours ago. The problem occurs constantly. The problem has not changed since onset.Nothing aggravates the symptoms. Nothing relieves the symptoms. He has tried nothing for the symptoms.    Past Medical History  Diagnosis Date  . Pneumonia    Past Surgical History  Procedure Laterality Date  . Knee cartilage surgery  2010  . Knee surgery Left 2010   No family history on file. Social History  Substance Use Topics  . Smoking status: Never Smoker   . Smokeless tobacco: None  . Alcohol Use: No    Review of Systems  Constitutional: Negative.   HENT: Positive for postnasal drip and sore throat.   Respiratory: Negative.   Cardiovascular: Negative.   Gastrointestinal: Negative.   Endocrine: Negative.   Genitourinary: Negative.   Musculoskeletal: Negative.   Allergic/Immunologic: Negative.   Neurological: Negative.   Hematological: Negative.   Psychiatric/Behavioral: Negative.     Allergies  Review of patient's allergies indicates no known allergies.  Home Medications   Prior to Admission medications   Medication Sig Start Date End Date Taking? Authorizing Provider  fluticasone (FLONASE) 50 MCG/ACT nasal spray Place 2 sprays into both nostrils daily. 02/27/16   Deatra CanterWilliam J Oxford, FNP  omeprazole (PRILOSEC) 20 MG capsule Take 1 capsule (20 mg total) by mouth daily. 05/16/15   Shade FloodJeffrey R Greene, MD   Meds Ordered and Administered this Visit  Medications - No data to display  BP 122/82  mmHg  Pulse 73  Temp(Src) 98.5 F (36.9 C) (Oral)  Resp 20  SpO2 98% No data found.   Physical Exam  Constitutional: He appears well-developed and well-nourished.  HENT:  Head: Normocephalic and atraumatic.  Right Ear: External ear normal.  Left Ear: External ear normal.  Mouth/Throat: Oropharynx is clear and moist.  Eyes: Conjunctivae are normal. Pupils are equal, round, and reactive to light.  Neck: Normal range of motion. Neck supple.  Cardiovascular: Normal rate and regular rhythm.   Pulmonary/Chest: Effort normal and breath sounds normal.  Abdominal: Soft. Bowel sounds are normal.    ED Course  Procedures (including critical care time)  Labs Review Labs Reviewed  POCT RAPID STREP A    Imaging Review No results found.   Visual Acuity Review  Right Eye Distance:   Left Eye Distance:   Bilateral Distance:    Right Eye Near:   Left Eye Near:    Bilateral Near:         MDM   1. Allergic rhinitis, unspecified allergic rhinitis type   2. Post-nasal drip    Flonase NS one spray per nostril qd. Recommend Warm Salt Water Gargles.    Deatra CanterWilliam J Oxford, FNP 02/27/16 [phone]

## 2016-02-29 LAB — CULTURE, GROUP A STREP (THRC)
